# Patient Record
Sex: Female | Born: 1960 | Race: Black or African American | Hispanic: No | Marital: Single | Smoking: Never smoker
Health system: Southern US, Community
[De-identification: ages and names within clinical notes are randomized; demographics above are authoritative.]

## PROBLEM LIST (undated history)

## (undated) DIAGNOSIS — M1990 Unspecified osteoarthritis, unspecified site: Secondary | ICD-10-CM

## (undated) HISTORY — PX: ANKLE SURGERY: SHX546

---

## 2001-12-05 HISTORY — PX: CHOLECYSTECTOMY: SHX55

## 2002-12-05 HISTORY — PX: ABDOMINAL HYSTERECTOMY: SHX81

## 2014-02-22 ENCOUNTER — Encounter (HOSPITAL_COMMUNITY): Payer: Self-pay | Admitting: Emergency Medicine

## 2014-02-22 ENCOUNTER — Emergency Department (HOSPITAL_COMMUNITY): Payer: Medicare Other

## 2014-02-22 ENCOUNTER — Observation Stay (HOSPITAL_COMMUNITY)
Admission: EM | Admit: 2014-02-22 | Discharge: 2014-02-24 | Disposition: A | Discharge status: Home / Self Care | Payer: Medicare Other | Attending: Internal Medicine | Admitting: Internal Medicine

## 2014-02-22 DIAGNOSIS — R21 Rash and other nonspecific skin eruption: Secondary | ICD-10-CM | POA: Diagnosis present

## 2014-02-22 DIAGNOSIS — M129 Arthropathy, unspecified: Secondary | ICD-10-CM | POA: Insufficient documentation

## 2014-02-22 DIAGNOSIS — S99919A Unspecified injury of unspecified ankle, initial encounter: Secondary | ICD-10-CM

## 2014-02-22 DIAGNOSIS — R296 Repeated falls: Secondary | ICD-10-CM | POA: Insufficient documentation

## 2014-02-22 DIAGNOSIS — R55 Syncope and collapse: Principal | ICD-10-CM | POA: Insufficient documentation

## 2014-02-22 DIAGNOSIS — L0291 Cutaneous abscess, unspecified: Secondary | ICD-10-CM | POA: Insufficient documentation

## 2014-02-22 DIAGNOSIS — E669 Obesity, unspecified: Secondary | ICD-10-CM | POA: Insufficient documentation

## 2014-02-22 DIAGNOSIS — M25579 Pain in unspecified ankle and joints of unspecified foot: Secondary | ICD-10-CM | POA: Diagnosis present

## 2014-02-22 DIAGNOSIS — Y929 Unspecified place or not applicable: Secondary | ICD-10-CM | POA: Insufficient documentation

## 2014-02-22 DIAGNOSIS — L0390 Cellulitis, unspecified: Secondary | ICD-10-CM

## 2014-02-22 DIAGNOSIS — Y9389 Activity, other specified: Secondary | ICD-10-CM | POA: Insufficient documentation

## 2014-02-22 DIAGNOSIS — Z792 Long term (current) use of antibiotics: Secondary | ICD-10-CM | POA: Insufficient documentation

## 2014-02-22 DIAGNOSIS — S8990XA Unspecified injury of unspecified lower leg, initial encounter: Secondary | ICD-10-CM | POA: Insufficient documentation

## 2014-02-22 DIAGNOSIS — R110 Nausea: Secondary | ICD-10-CM | POA: Insufficient documentation

## 2014-02-22 DIAGNOSIS — S99929A Unspecified injury of unspecified foot, initial encounter: Secondary | ICD-10-CM

## 2014-02-22 DIAGNOSIS — H538 Other visual disturbances: Secondary | ICD-10-CM | POA: Insufficient documentation

## 2014-02-22 DIAGNOSIS — Z79899 Other long term (current) drug therapy: Secondary | ICD-10-CM | POA: Insufficient documentation

## 2014-02-22 HISTORY — DX: Unspecified osteoarthritis, unspecified site: M19.90

## 2014-02-22 LAB — I-STAT CHEM 8, ED
BUN: 11 mg/dL (ref 6–23)
CHLORIDE: 103 meq/L (ref 96–112)
Calcium, Ion: 1.2 mmol/L (ref 1.12–1.23)
Creatinine, Ser: 0.8 mg/dL (ref 0.50–1.10)
Glucose, Bld: 102 mg/dL — ABNORMAL HIGH (ref 70–99)
HCT: 46 % (ref 36.0–46.0)
Hemoglobin: 15.6 g/dL — ABNORMAL HIGH (ref 12.0–15.0)
Potassium: 4.1 mEq/L (ref 3.7–5.3)
SODIUM: 141 meq/L (ref 137–147)
TCO2: 26 mmol/L (ref 0–100)

## 2014-02-22 LAB — I-STAT TROPONIN, ED
Troponin i, poc: 0 ng/mL (ref 0.00–0.08)
Troponin i, poc: 0 ng/mL (ref 0.00–0.08)

## 2014-02-22 MED ORDER — HYDROXYZINE HCL 25 MG PO TABS
25.0000 mg | ORAL_TABLET | Freq: Once | ORAL | Status: AC
  Administered 2014-02-22: 25 mg via ORAL
  Filled 2014-02-22: qty 1

## 2014-02-22 MED ORDER — ENOXAPARIN SODIUM 80 MG/0.8ML ~~LOC~~ SOLN
80.0000 mg | Freq: Every day | SUBCUTANEOUS | Status: DC
  Administered 2014-02-23 – 2014-02-24 (×2): 80 mg via SUBCUTANEOUS
  Filled 2014-02-22 (×2): qty 0.8

## 2014-02-22 MED ORDER — HYDROXYZINE HCL 25 MG PO TABS: 25.0000 mg | ORAL_TABLET | Freq: Four times a day (QID) | ORAL | Status: DC

## 2014-02-22 MED ORDER — ONDANSETRON HCL 4 MG PO TABS: 4.0000 mg | ORAL_TABLET | Freq: Four times a day (QID) | ORAL | Status: DC | PRN

## 2014-02-22 MED ORDER — ASPIRIN EC 81 MG PO TBEC
81.0000 mg | DELAYED_RELEASE_TABLET | Freq: Every day | ORAL | Status: DC
  Administered 2014-02-23 – 2014-02-24 (×2): 81 mg via ORAL
  Filled 2014-02-22 (×2): qty 1

## 2014-02-22 MED ORDER — SODIUM CHLORIDE 0.9 % IV SOLN
INTRAVENOUS | Status: DC
  Administered 2014-02-23: 02:00:00 via INTRAVENOUS

## 2014-02-22 MED ORDER — ONDANSETRON HCL 4 MG/2ML IJ SOLN
4.0000 mg | Freq: Once | INTRAMUSCULAR | Status: AC
  Administered 2014-02-22: 4 mg via INTRAVENOUS
  Filled 2014-02-22: qty 2

## 2014-02-22 MED ORDER — ACETAMINOPHEN 325 MG PO TABS: 650.0000 mg | ORAL_TABLET | Freq: Four times a day (QID) | ORAL | Status: DC | PRN

## 2014-02-22 MED ORDER — ACETAMINOPHEN 650 MG RE SUPP: 650.0000 mg | Freq: Four times a day (QID) | RECTAL | Status: DC | PRN

## 2014-02-22 MED ORDER — ONDANSETRON HCL 4 MG/2ML IJ SOLN
4.0000 mg | Freq: Four times a day (QID) | INTRAMUSCULAR | Status: DC | PRN
Start: 2014-02-22 — End: 2014-02-24
  Administered 2014-02-24: 4 mg via INTRAVENOUS
  Filled 2014-02-22: qty 2

## 2014-02-22 MED ORDER — FAMOTIDINE 20 MG PO TABS
20.0000 mg | ORAL_TABLET | Freq: Two times a day (BID) | ORAL | Status: DC
  Administered 2014-02-23 – 2014-02-24 (×3): 20 mg via ORAL
  Filled 2014-02-22 (×5): qty 1

## 2014-02-22 MED ORDER — CLINDAMYCIN HCL 150 MG PO CAPS: 450.0000 mg | ORAL_CAPSULE | Freq: Three times a day (TID) | ORAL | Status: DC

## 2014-02-22 MED ORDER — SODIUM CHLORIDE 0.9 % IJ SOLN
3.0000 mL | Freq: Two times a day (BID) | INTRAMUSCULAR | Status: DC
  Administered 2014-02-23: 3 mL via INTRAVENOUS

## 2014-02-22 NOTE — ED Notes (Addendum)
Pt sts she was at the sink washing out a container then next thing she remembers is waking up on the floor. Pt sts only pain is to her left ankle. Denies neck pain, back pain, increased difficulty walking. Denies use of blood thinners. Pt has slight swelling to anterior lateral ankle. Pt has some red raised bumps on arms and legs, reports she had recent contact with a dog and they started after that. Pt reports normal PO intake the past couple of days. sts she drove into town early this morning, sts she did sleep good last night and felt normal this morning. Nad, skin warm and dry, resp e/u.

## 2014-02-22 NOTE — ED Notes (Signed)
Central monitor system down, unable to print ekg. EKG has been performed and viewable in EPIC

## 2014-02-22 NOTE — ED Notes (Signed)
Pt ambulated down hall by Debbie, NT. Pt O2 saturation 100%, HR 108. Pt had mild difficulty walking due to pain in left foot otherwise no drop in O2 noted.

## 2014-02-22 NOTE — H&P (Signed)
[facility] History and Physical  Kalen Neidert ZOX:[id number] DOB: 24-Jan-1961 DOA: 02/22/2014  Referring physician: Dr. Fredderick Phenix, ED physician PCP: No primary provider on file.   Chief Complaint: syncope  HPI: Molly Hoffman is a 53 y.o. female with history of morbid obesity and family history of coronary artery disease. Patient was in her usual state of health today, she was washing dishes when she suddenly had a syncopal episode. Per patient, she did not appear to have any presyncopal symptoms. She was watching dishes and then suddenly woke up on the floor. She denies any presyncopal lightheadedness, dizziness. She did not have any urinary or bowel incontinence. She did not have any postictal period. The entire episode reportedly lasted 1.5-2 minutes. Patient is now back to baseline. She did not have any chest pain or shortness of breath. No nausea vomiting, diarrhea. She reports her by mouth intake has been adequate for the past few days. She does not report having a fever. The patient had been traveling between [place] and [place]. She does not describe any sick contacts. She has no previous history of the same. She also reports noticing a erythematous rash wrapped over the last few days which has progressively gotten worse. Rash is pruritic. She feels may be related to possible flea bites from a dog she had held several days before. Rash is over her arms and her legs. Patient is being admitted to observation for further workup.   Review of Systems:  Pertinent positives as per HPI, otherwise negative  Past Medical History  Diagnosis Date  . Arthritis    Past Surgical History  Procedure Laterality Date  . Cesarean section  1984  . Abdominal hysterectomy  2004  . Ankle surgery Right   . Cholecystectomy  2003   Social History:  reports that she has never smoked. She does not have any smokeless tobacco history on file. She reports that she does not drink alcohol or use illicit  drugs.  No Known Allergies  Family history: sister died in her 30s due to MI   Prior to Admission medications   Medication Sig Start Date End Date Taking? Authorizing Provider  diclofenac (VOLTAREN) 25 MG EC tablet Take 25 mg by mouth 2 (two) times daily as needed for mild pain.    Yes Historical Provider, MD  loratadine (CLARITIN) 10 MG tablet Take 10 mg by mouth daily as needed for allergies.   Yes Historical Provider, MD  ranitidine (ZANTAC) 150 MG tablet Take 150 mg by mouth 2 (two) times daily as needed for heartburn.   Yes Historical Provider, MD  clindamycin (CLEOCIN) 150 MG capsule Take 3 capsules (450 mg total) by mouth 3 (three) times daily. 02/22/14   Lauren Doretha Imus, PA-C  hydrOXYzine (ATARAX/VISTARIL) 25 MG tablet Take 1 tablet (25 mg total) by mouth every 6 (six) hours. 02/22/14   Clabe Seal, PA-C   Physical Exam: Filed Vitals:   02/22/14 2241  BP: 107/51  Pulse:   Temp:   Resp: 25    BP 107/51  Pulse 84  Temp(Src) 98.5 F (36.9 C) (Oral)  Resp 25  Ht 5\' 2"  (1.575 m)  Wt 158.305 kg (349 lb)  BMI 63.82 kg/m2  SpO2 100%  General:  Appears calm and comfortable, morbidly obese Eyes: PERRL, normal lids, irises & conjunctiva ENT: grossly normal hearing, lips & tongue Neck: no LAD, masses or thyromegaly Cardiovascular: RRR, no m/r/g. No LE edema. Telemetry: SR, no arrhythmias  Respiratory: CTA bilaterally, no w/r/r. Normal respiratory  effort. Abdomen: soft, ntnd Skin: patchy erythematous lesions on arms and legs, pruritic, no central clearing Musculoskeletal: grossly normal tone BUE/BLE Psychiatric: grossly normal mood and affect, speech fluent and appropriate Neurologic: grossly non-focal.          Labs on Admission:  Basic Metabolic Panel:  Recent Labs Lab 02/22/14 1533  NA 141  K 4.1  CL 103  GLUCOSE 102*  BUN 11  CREATININE 0.80   Liver Function Tests: No results found for this basename: AST, ALT, ALKPHOS, BILITOT, PROT, ALBUMIN,  in the  last 168 hours No results found for this basename: LIPASE, AMYLASE,  in the last 168 hours No results found for this basename: AMMONIA,  in the last 168 hours CBC:  Recent Labs Lab 02/22/14 1533  HGB 15.6*  HCT 46.0   Cardiac Enzymes: No results found for this basename: CKTOTAL, CKMB, CKMBINDEX, TROPONINI,  in the last 168 hours  BNP (last 3 results) No results found for this basename: PROBNP,  in the last 8760 hours CBG: No results found for this basename: GLUCAP,  in the last 168 hours  Radiological Exams on Admission: Dg Foot Complete Left  02/22/2014   CLINICAL DATA:  Left foot pain after injury.  EXAM: LEFT FOOT - COMPLETE 3+ VIEW  COMPARISON:  None.  FINDINGS: There is no evidence of fracture or dislocation. Joint spaces are intact. Spurring of posterior calcaneus is noted. Soft tissues are unremarkable.  IMPRESSION: No acute abnormality seen in the left foot.   Electronically Signed   By: Roque LiasJames  Birdsall M.D.   On: 02/22/2014 16:07    EKG: Independently reviewed. No acute findings  Assessment/Plan Principal Problem:   Syncope Active Problems:   Rash   Morbid obesity   Ankle pain   1. Syncope. Possibly vasovagal in origin. Patient was monitored under observation on telemetry. We'll cycle cardiac markers. Check EKG in the morning and 2-D echocardiogram. Patient will be hydrated with IV fluids. Check orthostatic vital signs on arrival to the nursing unit. Of note, there was a recorded blood pressure of 83/49 on admission. That has since improved with IV fluids. 2. Ankle pain. Patient reports pain in her ankle since falling. We will check d-dimer. X-rays of the ankle are unremarkable. She does have pain on flexion, but this may be related to musculoskeletal injury. If D-dimer is positive, would consider checking venous Dopplers and possible CT scan to the chest in light of #1. 3. Rash. Etiology is unclear. Does not appear allergic in origin. She does not report any new  medications. Its appearance suggests possible insect bites, although patient does not report history of the same. We'll check RPR. This can possibly be worked up further as an outpatient. 4. Morbid obesity. Counseled on the importance of diet and exercise.   Code Status: full code Family Communication: discussed with patient Disposition Plan: discharge home once improved  Time spent: 60mins  [facility] Surgery Center IncMEMON,JEHANZEB [facility] Pager [phone]

## 2014-02-22 NOTE — ED Notes (Signed)
Per EMS - pt was cleaning at the sink with she had a syncopal episode and fell, family witnessed this episode stating it lasted for about 2 mins, pt hit head on stove when she fell down. Pt denies any cardiac hx, only hx of arthritis. Pt denies neck/back pain, passed ems spinal test. 20 G left AC. Pt c/o nausea and left ankle pain. BP 138/70 HR 90s CBG 97. ST on 12 lead.

## 2014-02-22 NOTE — ED Notes (Signed)
Phlebotomy at bedside, attempting to get troponin.

## 2014-02-22 NOTE — ED Notes (Signed)
Pt up to bathroom, able to bear wt on left foot.

## 2014-02-22 NOTE — ED Notes (Signed)
Patient transported to X-ray 

## 2014-02-22 NOTE — ED Notes (Signed)
Admitting physician at bedside

## 2014-02-22 NOTE — ED Provider Notes (Signed)
CSN: [id number]     Arrival date & time 02/22/14  1501 History   First MD Initiated Contact with Patient 02/22/14 1512     Chief Complaint  Patient presents with  . Loss of Consciousness     (Consider location/radiation/quality/duration/timing/severity/associated sxs/prior Treatment) HPI Comments: Molly Hoffman is a 53 y.o. female with a past medical history of obesity presenting the Emergency Department with a chief complaint of syncopal event.  The patient reports she was standing at the sink washing dishes when she had blurry vision and passed out.  The patient's nice reports witnessing the event and reports the patient fell backward and is unsure if she hit her head on the stove. She reports the patient had a LOC for approximately 1.5 minutes.  She reports she awoke by herself.  Denies Loss of bowel or bladder, denies extremity shaking or movements.  The patient reports vision returned to baseline. She denies headache, vomiting, lightheaded, dizziness.  She denies history of MI, murmur, arrythmia, denies history of early MI in family.  She denies chest pain, palpitations, SOB, lower extremity swelling.  She reports rash for several days to left upper extremity and lower extremities. Denies dark stools or BRBPR. PCP in [place]. Reports she moved to [place] for the next 6 months  The history is provided by the patient and a relative. No language interpreter was used.    Past Medical History  Diagnosis Date  . Arthritis    Past Surgical History  Procedure Laterality Date  . Cesarean section  1984  . Abdominal hysterectomy  2004  . Ankle surgery Right   . Cholecystectomy  2003   No family history on file. History  Substance Use Topics  . Smoking status: Never Smoker   . Smokeless tobacco: Not on file  . Alcohol Use: No   OB History   Grav Para Term Preterm Abortions TAB SAB Ect Mult Living                 Review of Systems  Constitutional: Negative for fever and chills.    Eyes: Negative for photophobia.  Cardiovascular: Negative for chest pain, palpitations and leg swelling.  Gastrointestinal: Positive for nausea. Negative for vomiting, abdominal pain, diarrhea, constipation, blood in stool and anal bleeding.  Neurological: Positive for syncope. Negative for dizziness, light-headedness and headaches.  All other systems reviewed and are negative.      Allergies  Review of patient's allergies indicates no known allergies.  Home Medications   Current Outpatient Rx  Name  Route  Sig  Dispense  Refill  . diclofenac (VOLTAREN) 25 MG EC tablet   Oral   Take 25 mg by mouth 2 (two) times daily as needed for mild pain.          Marland Kitchen loratadine (CLARITIN) 10 MG tablet   Oral   Take 10 mg by mouth daily as needed for allergies.         . ranitidine (ZANTAC) 150 MG tablet   Oral   Take 150 mg by mouth 2 (two) times daily as needed for heartburn.         . clindamycin (CLEOCIN) 150 MG capsule   Oral   Take 3 capsules (450 mg total) by mouth 3 (three) times daily.   90 capsule   0   . hydrOXYzine (ATARAX/VISTARIL) 25 MG tablet   Oral   Take 1 tablet (25 mg total) by mouth every 6 (six) hours.   20 tablet   0  BP 98/64  Pulse 92  Temp(Src) 98.5 F (36.9 C) (Oral)  Resp 25  Ht 5\' 2"  (1.575 m)  Wt 349 lb (158.305 kg)  BMI 63.82 kg/m2  SpO2 98% Physical Exam  Nursing note and vitals reviewed. Constitutional: She is oriented to person, place, and time. She appears well-developed and well-nourished. No distress.  Exam limited by patient's body habitus.    HENT:  Head: Normocephalic and atraumatic.  Eyes: EOM are normal. Pupils are equal, round, and reactive to light. No scleral icterus.  Neck: Neck supple.  Cardiovascular: Normal rate, regular rhythm and normal heart sounds.   No murmur heard. No lower extremity edema  Pulmonary/Chest: Effort normal and breath sounds normal. She has no wheezes. She has no rales.  Abdominal: Soft.  Bowel sounds are normal. There is no tenderness. There is no rebound and no guarding.  Musculoskeletal: Normal range of motion. She exhibits no edema.       Left ankle: Tenderness.       Feet:  Neurological: She is alert and oriented to person, place, and time.  Speech is clear and goal oriented, follows commands Cranial nerves III - XII grossly intact, no facial droop Normal strength in upper and lower extremities bilaterally, strong and equal grip strength Sensation normal to light and sharp touch Moves all 4 extremities without ataxia, coordination intact Normal finger to nose and rapid alternating movements No pronator drift. Normal heel to shin, bilaterally    Skin: Skin is warm and dry. Rash noted.  Multiple areas of erythremic lesions without obvious abscess.   Psychiatric: She has a normal mood and affect.    ED Course  Procedures (including critical care time) Labs Review Labs Reviewed  I-STAT CHEM 8, ED - Abnormal; Notable for the following:    Glucose, Bld 102 (*)    Hemoglobin 15.6 (*)    All other components within normal limits  I-STAT TROPOININ, ED  I-STAT TROPOININ, ED   Imaging Review Dg Foot Complete Left  02/22/2014   CLINICAL DATA:  Left foot pain after injury.  EXAM: LEFT FOOT - COMPLETE 3+ VIEW  COMPARISON:  None.  FINDINGS: There is no evidence of fracture or dislocation. Joint spaces are intact. Spurring of posterior calcaneus is noted. Soft tissues are unremarkable.  IMPRESSION: No acute abnormality seen in the left foot.   Electronically Signed   By: Roque Lias M.D.   On: 02/22/2014 16:07     EKG Interpretation   Date/Time:  Saturday February 22 2014 15:09:40 EDT Ventricular Rate:  88 PR Interval:  170 QRS Duration: 84 QT Interval:  382 QTC Calculation: 462 R Axis:   47 Text Interpretation:  Sinus rhythm No old tracing to compare Confirmed by  BELFI  MD, MELANIE ([id number]) on 02/22/2014 3:39:05 PM      MDM   Final diagnoses:  Syncope   Cellulitis  Rash   Pt with an episode of syncope, asymptomatic here. No Cardiac complains of Neurologic findings on exam.Complains of nausea and purities from the rash. Labs and XR normal.  EKG, normal. Re-eval-Pt currently asymptomatic, denies HA or lightheadedness.  2110 Re-eval pt denies HA, chest pain, SOB. Upon further questioning the patient reports her sister died at 12 due to a heart attack while visiting her father in the hospital while he was in a coma. HEART score 2 points, 0.9-1.7% risk. Discussed lab results, imaging results with the patient and patient's sister.  Pt is requesting to stay for further evaluation at this  time. Pt care signed out to Piepenbrink, PA-C for admission.   Meds given in ED:  Medications  hydrOXYzine (ATARAX/VISTARIL) tablet 25 mg (not administered)  ondansetron (ZOFRAN) injection 4 mg (4 mg Intravenous Given 02/22/14 1630)  hydrOXYzine (ATARAX/VISTARIL) tablet 25 mg (25 mg Oral Given 02/22/14 1929)    New Prescriptions   CLINDAMYCIN (CLEOCIN) 150 MG CAPSULE    Take 3 capsules (450 mg total) by mouth 3 (three) times daily.   HYDROXYZINE (ATARAX/VISTARIL) 25 MG TABLET    Take 1 tablet (25 mg total) by mouth every 6 (six) hours.      Clabe SealLauren M Anedra Penafiel, PA-C 02/22/14 2125

## 2014-02-22 NOTE — ED Provider Notes (Signed)
Patient is a 53 yo F PMHx significant for arthritis and obesity presenting for syncopal episode with visual disturbance. No CP, SOB, dizziness, lightheadedness. Symptoms resolved once patient awoke. No further episodes. No neurofocal findings on examination per Mellody DrownLauren Parker, PA-C upon arrival. I have reviewed nursing notes, vital signs, and all appropriate lab results for this patient. EKG unremarkable. Patient is visiting from out of town. No follow up here. Family history of sudden cardiac death. Patient uncomfortable with discharge home. Will observe patient overnight. Discussed with hospitalist.   1. Syncope   2. Cellulitis   3. Rash   4. Ankle pain   5. Morbid obesity     Medications  ondansetron (ZOFRAN) injection 4 mg (4 mg Intravenous Given 02/22/14 1630)  hydrOXYzine (ATARAX/VISTARIL) tablet 25 mg (25 mg Oral Given 02/22/14 1929)  hydrOXYzine (ATARAX/VISTARIL) tablet 25 mg (25 mg Oral Given 02/22/14 2132)   Results for orders placed during the hospital encounter of 02/22/14  I-STAT CHEM 8, ED      Result Value Ref Range   Sodium 141  137 - 147 mEq/L   Potassium 4.1  3.7 - 5.3 mEq/L   Chloride 103  96 - 112 mEq/L   BUN 11  6 - 23 mg/dL   Creatinine, Ser 1.610.80  0.50 - 1.10 mg/dL   Glucose, Bld 096102 (*) 70 - 99 mg/dL   Calcium, Ion 0.451.20  1.12 - 1.23 mmol/L   TCO2 26  0 - 100 mmol/L   Hemoglobin 15.6 (*) 12.0 - 15.0 g/dL   HCT 40.946.0  81.136.0 - 91.446.0 %  I-STAT TROPOININ, ED      Result Value Ref Range   Troponin i, poc 0.00  0.00 - 0.08 ng/mL   Comment 3           I-STAT TROPOININ, ED      Result Value Ref Range   Troponin i, poc 0.00  0.00 - 0.08 ng/mL   Comment 3            Dg Foot Complete Left  02/22/2014   CLINICAL DATA:  Left foot pain after injury.  EXAM: LEFT FOOT - COMPLETE 3+ VIEW  COMPARISON:  None.  FINDINGS: There is no evidence of fracture or dislocation. Joint spaces are intact. Spurring of posterior calcaneus is noted. Soft tissues are unremarkable.  IMPRESSION: No  acute abnormality seen in the left foot.   Electronically Signed   By: Roque LiasJames  Denning M.D.   On: 02/22/2014 16:07       Jeannetta EllisJennifer L Jarious Lyon, PA-C 02/22/14 2331

## 2014-02-23 ENCOUNTER — Observation Stay (HOSPITAL_COMMUNITY): Payer: Medicare Other

## 2014-02-23 ENCOUNTER — Encounter (HOSPITAL_COMMUNITY): Payer: Self-pay | Admitting: *Deleted

## 2014-02-23 DIAGNOSIS — R55 Syncope and collapse: Secondary | ICD-10-CM

## 2014-02-23 LAB — CBC
HCT: 39.8 % (ref 36.0–46.0)
HCT: 41.7 % (ref 36.0–46.0)
Hemoglobin: 13.1 g/dL (ref 12.0–15.0)
Hemoglobin: 13.7 g/dL (ref 12.0–15.0)
MCH: 27.1 pg (ref 26.0–34.0)
MCH: 27.2 pg (ref 26.0–34.0)
MCHC: 32.9 g/dL (ref 30.0–36.0)
MCHC: 32.9 g/dL (ref 30.0–36.0)
MCV: 82.4 fL (ref 78.0–100.0)
MCV: 82.7 fL (ref 78.0–100.0)
PLATELETS: 201 10*3/uL (ref 150–400)
Platelets: 199 10*3/uL (ref 150–400)
RBC: 4.83 MIL/uL (ref 3.87–5.11)
RBC: 5.04 MIL/uL (ref 3.87–5.11)
RDW: 14.3 % (ref 11.5–15.5)
RDW: 14.3 % (ref 11.5–15.5)
WBC: 6.4 10*3/uL (ref 4.0–10.5)
WBC: 6.2 10*3/uL (ref 4.0–10.5)

## 2014-02-23 LAB — COMPREHENSIVE METABOLIC PANEL
ALBUMIN: 3.2 g/dL — AB (ref 3.5–5.2)
ALT: 13 U/L (ref 0–35)
AST: 16 U/L (ref 0–37)
Alkaline Phosphatase: 133 U/L — ABNORMAL HIGH (ref 39–117)
BUN: 10 mg/dL (ref 6–23)
CALCIUM: 9.2 mg/dL (ref 8.4–10.5)
CO2: 24 mEq/L (ref 19–32)
CREATININE: 0.77 mg/dL (ref 0.50–1.10)
Chloride: 103 mEq/L (ref 96–112)
GFR calc Af Amer: >90 mL/min (ref >90)
GFR calc non Af Amer: >90 mL/min (ref >90)
Glucose, Bld: 125 mg/dL — ABNORMAL HIGH (ref 70–99)
Potassium: 4.1 mEq/L (ref 3.7–5.3)
Sodium: 140 mEq/L (ref 137–147)
Total Bilirubin: 0.5 mg/dL (ref 0.3–1.2)
Total Protein: 7.3 g/dL (ref 6.0–8.3)

## 2014-02-23 LAB — URINALYSIS, ROUTINE W REFLEX MICROSCOPIC
Bilirubin Urine: NEGATIVE
GLUCOSE, UA: NEGATIVE mg/dL
Hgb urine dipstick: NEGATIVE
Ketones, ur: NEGATIVE mg/dL
LEUKOCYTES UA: NEGATIVE
Nitrite: NEGATIVE
PH: 5.5 (ref 5.0–8.0)
Protein, ur: NEGATIVE mg/dL
Specific Gravity, Urine: 1.031 — ABNORMAL HIGH (ref 1.005–1.030)
Urobilinogen, UA: 0.2 mg/dL (ref 0.0–1.0)

## 2014-02-23 LAB — TROPONIN I
Troponin I: <0.3 ng/mL (ref <0.30)
Troponin I: <0.3 ng/mL (ref <0.30)

## 2014-02-23 LAB — CREATININE, SERUM: Creatinine, Ser: 0.79 mg/dL (ref 0.50–1.10)

## 2014-02-23 LAB — RPR: RPR Ser Ql: NONREACTIVE

## 2014-02-23 LAB — D-DIMER, QUANTITATIVE (NOT AT ARMC): D DIMER QUANT: 2.92 ug{FEU}/mL — AB (ref 0.00–0.48)

## 2014-02-23 LAB — HIV ANTIBODY (ROUTINE TESTING W REFLEX): HIV: NONREACTIVE

## 2014-02-23 LAB — TSH: TSH: 1.699 u[IU]/mL (ref 0.350–4.500)

## 2014-02-23 MED ORDER — SODIUM CHLORIDE 0.9 % IV SOLN: INTRAVENOUS | Status: AC

## 2014-02-23 MED ORDER — HYDROCORTISONE 0.5 % EX CREA
TOPICAL_CREAM | Freq: Two times a day (BID) | CUTANEOUS | Status: DC
  Administered 2014-02-23 – 2014-02-24 (×4): via TOPICAL
  Filled 2014-02-23: qty 28.35

## 2014-02-23 MED ORDER — DIPHENHYDRAMINE HCL 25 MG PO CAPS: 25.0000 mg | ORAL_CAPSULE | Freq: Four times a day (QID) | ORAL | Status: DC | PRN

## 2014-02-23 MED ORDER — SODIUM CHLORIDE 0.9 % IV SOLN: INTRAVENOUS | Status: DC

## 2014-02-23 MED ORDER — DIPHENHYDRAMINE HCL 25 MG PO CAPS: 25.0000 mg | ORAL_CAPSULE | Freq: Three times a day (TID) | ORAL | Status: AC | PRN

## 2014-02-23 MED ORDER — DIPHENHYDRAMINE HCL 25 MG PO CAPS: 25.0000 mg | ORAL_CAPSULE | Freq: Three times a day (TID) | ORAL | Status: DC | PRN

## 2014-02-23 MED ORDER — IOHEXOL 350 MG/ML SOLN
100.0000 mL | Freq: Once | INTRAVENOUS | Status: AC | PRN
  Administered 2014-02-23: 100 mL via INTRAVENOUS

## 2014-02-23 MED ORDER — DOXYCYCLINE HYCLATE 100 MG PO TABS
100.0000 mg | ORAL_TABLET | Freq: Two times a day (BID) | ORAL | Status: DC
  Administered 2014-02-23 – 2014-02-24 (×3): 100 mg via ORAL
  Filled 2014-02-23 (×4): qty 1

## 2014-02-23 MED ORDER — HYDROCODONE-ACETAMINOPHEN 5-325 MG PO TABS
1.0000 | ORAL_TABLET | Freq: Three times a day (TID) | ORAL | Status: DC | PRN
  Administered 2014-02-23 (×2): 1 via ORAL
  Filled 2014-02-23 (×3): qty 1

## 2014-02-23 MED ORDER — DOXYCYCLINE HYCLATE 100 MG PO TABS: 100.0000 mg | ORAL_TABLET | Freq: Two times a day (BID) | ORAL | Status: AC

## 2014-02-23 NOTE — Progress Notes (Signed)
Patient's d-dimer was positive at 2.92. Notified Dr. Joseph ArtWoods. Patient is asymptomatic at this time. New orders for a CT-Angio STAT. Will notify the patient and continue to monitor. Earnest ConroyFarlow, Aryanne Gilleland Denise RN

## 2014-02-23 NOTE — ED Provider Notes (Signed)
Medical screening examination/treatment/procedure(s) were performed by non-physician practitioner and as supervising physician I was immediately available for consultation/collaboration.   EKG Interpretation   Date/Time:  Saturday February 22 2014 15:09:40 EDT Ventricular Rate:  88 PR Interval:  170 QRS Duration: 84 QT Interval:  382 QTC Calculation: 462 R Axis:   47 Text Interpretation:  Sinus rhythm No old tracing to compare Confirmed by  Jocob Dambach  MD, Annaclaire Walsworth ([id number]) on 02/22/2014 3:39:05 PM        Rolan BuccoMelanie Eliyanna Ault, MD 02/23/14 1059

## 2014-02-23 NOTE — Progress Notes (Addendum)
Patient Demographics  Molly Hoffman, is a 53 y.o. female, DOB - 30-Nov-1961, WUJ:[id number]  Admit date - 02/22/2014   Admitting Physician Erick BlinksJehanzeb Memon, MD  Outpatient Primary MD for the patient is No primary provider on file.  LOS - 1   Chief Complaint  Patient presents with  . Loss of Consciousness        Assessment & Plan    1. Syncope - most likely secondary to orthostatic hypotension, will give IV fluids, monitor orthostatics, increase activity, cycle troponins which have been negative, have PT eval. Echo ordered upon admission is pending. If symptom free after IV fluids an echo stable can be discharged home.    2. Left ankle discomfort and pain. X-ray of ankle and foot stable, likely soft tissue sprain, will give trial of NSAIDs and monitor. He due to evaluate, walking cane ordered.    3. Morbid obesity. Outpatient followup with PCP.    4.Rash - on R arm and thigh - ? Contact dermatitis vs skin infection - doxy and benadryl     Code Status: Full  Family Communication:    Disposition Plan: Home   Procedures Echo   Consults       Medications  Scheduled Meds: . aspirin EC  81 mg Oral Daily  . enoxaparin (LOVENOX) injection  80 mg Subcutaneous Daily  . famotidine  20 mg Oral BID  . hydrocortisone cream   Topical BID  . sodium chloride  3 mL Intravenous Q12H   Continuous Infusions: . sodium chloride     PRN Meds:.acetaminophen, acetaminophen, HYDROcodone-acetaminophen, ondansetron (ZOFRAN) IV, ondansetron  DVT Prophylaxis  Lovenox    Lab Results  Component Value Date   PLT 201 02/23/2014    Antibiotics     Anti-infectives   Start     Dose/Rate Route Frequency Ordered Stop   02/22/14 0000  clindamycin (CLEOCIN) 150 MG capsule     450 mg Oral 3 times daily  02/22/14 2056            Subjective:   Molly Hoffman today has, No headache, No chest pain, No abdominal pain - No Nausea, No new weakness tingling or numbness, No Cough - SOB.    Objective:   Filed Vitals:   02/23/14 0000 02/23/14 0001 02/23/14 0002 02/23/14 0625  BP: 137/62 107/58 120/65 127/66  Pulse: 85 84 107 88  Temp: 98.4 F (36.9 C)   98.9 F (37.2 C)  TempSrc: Oral   Oral  Resp: 16   16  Height: 5\' 2"  (1.575 m)     Weight: 162.887 kg (359 lb 1.6 oz)     SpO2: 100%   98%    Wt Readings from Last 3 Encounters:  02/23/14 162.887 kg (359 lb 1.6 oz)    No intake or output data in the 24 hours ending 02/23/14 1005   Physical Exam  Awake Alert, Oriented X 3, No new F.N deficits, Normal affect [place].AT,PERRAL Supple Neck,No JVD, No cervical lymphadenopathy appriciated.  Symmetrical Chest wall movement, Good air movement bilaterally, CTAB RRR,No Gallops,Rubs or new Murmurs, No Parasternal Heave +ve B.Sounds, Abd Soft, Non tender, No organomegaly appriciated, No rebound - guarding or rigidity. No Cyanosis, Clubbing or edema, No new Rash or bruise  Data Review   Micro Results No results found for this or any previous visit (from the past 240 hour(s)).  Radiology Reports Dg Ankle Complete Left  02/23/2014   CLINICAL DATA:  Fall, left ankle pain  EXAM: LEFT ANKLE COMPLETE - 3+ VIEW  COMPARISON:  None.  FINDINGS: No fracture or dislocation is seen.  The joint spaces are preserved.  Moderate plantar and posterior calcaneal enthesophytes.  Visualized soft tissues are grossly unremarkable.  IMPRESSION: No fracture or dislocation is seen.   Electronically Signed   By: Charline Bills M.D.   On: 02/23/2014 08:39   Dg Foot Complete Left  02/22/2014   CLINICAL DATA:  Left foot pain after injury.  EXAM: LEFT FOOT - COMPLETE 3+ VIEW  COMPARISON:  None.  FINDINGS: There is no evidence of fracture or dislocation. Joint spaces are intact. Spurring of posterior calcaneus is  noted. Soft tissues are unremarkable.  IMPRESSION: No acute abnormality seen in the left foot.   Electronically Signed   By: Roque Lias M.D.   On: 02/22/2014 16:07    CBC  Recent Labs Lab 02/22/14 1533 02/23/14 0546 02/23/14 0847  WBC  --  6.4 6.2  HGB 15.6* 13.1 13.7  HCT 46.0 39.8 41.7  PLT  --  199 201  MCV  --  82.4 82.7  MCH  --  27.1 27.2  MCHC  --  32.9 32.9  RDW  --  14.3 14.3    Chemistries   Recent Labs Lab 02/22/14 1533 02/23/14 0546 02/23/14 0847  NA 141 140  --   K 4.1 4.1  --   CL 103 103  --   CO2  --  24  --   GLUCOSE 102* 125*  --   BUN 11 10  --   CREATININE 0.80 0.77 0.79  CALCIUM  --  9.2  --   AST  --  16  --   ALT  --  13  --   ALKPHOS  --  133*  --   BILITOT  --  0.5  --    ------------------------------------------------------------------------------------------------------------------ estimated creatinine clearance is 125 ml/min (by C-G formula based on Cr of 0.79). ------------------------------------------------------------------------------------------------------------------ No results found for this basename: HGBA1C,  in the last 72 hours ------------------------------------------------------------------------------------------------------------------ No results found for this basename: CHOL, HDL, LDLCALC, TRIG, CHOLHDL, LDLDIRECT,  in the last 72 hours ------------------------------------------------------------------------------------------------------------------ No results found for this basename: TSH, T4TOTAL, FREET3, T3FREE, THYROIDAB,  in the last 72 hours ------------------------------------------------------------------------------------------------------------------ No results found for this basename: VITAMINB12, FOLATE, FERRITIN, TIBC, IRON, RETICCTPCT,  in the last 72 hours  Coagulation profile No results found for this basename: INR, PROTIME,  in the last 168 hours   Recent Labs  02/23/14 0847  DDIMER 2.92*     Cardiac Enzymes  Recent Labs Lab 02/23/14 0546  TROPONINI <0.30   ------------------------------------------------------------------------------------------------------------------ No components found with this basename: POCBNP,      Time Spent in minutes 35   Jasmyne Lodato K M.D on 02/23/2014 at 10:05 AM  Between 7am to 7pm - Pager - [phone]  After 7pm go to [email or web address] - password [email or web address]  And look for the night coverage person covering for me after hours  [facility] Office  [phone]

## 2014-02-23 NOTE — Care Management Note (Signed)
         A/P  -D-Dimer 2.92; CT angiogram pending

## 2014-02-23 NOTE — Progress Notes (Signed)
Utilization review completed.  

## 2014-02-24 DIAGNOSIS — I369 Nonrheumatic tricuspid valve disorder, unspecified: Secondary | ICD-10-CM

## 2014-02-24 MED ORDER — SODIUM CHLORIDE 0.9 % IV SOLN
INTRAVENOUS | Status: DC
  Administered 2014-02-24: 05:00:00 via INTRAVENOUS

## 2014-02-24 NOTE — Progress Notes (Signed)
DC orders received.  Patient stable with no S/S of distress.  Medication and discharge information reviewed with patient.  Patient DC home. Molly Hoffman  

## 2014-02-24 NOTE — Progress Notes (Signed)
  Page secondary to patient's d-dimer=2.92. Patient asymptomatic ambulating around the ward.             A/P  -D-Dimer 2.92; CT angiogram 02/23/2014  Examination is limited by patient body habitus. No definite filling  defects are identified in the main, lobar, or proximal segmental  pulmonary arteries.

## 2014-02-24 NOTE — Progress Notes (Signed)
PT Cancellation Note  Patient Details Name: Molly Hoffman MRN: [id number] DOB: January 24, 1961   Cancelled Treatment:    Reason Eval/Treat Not Completed: Other (comment) (pt bathing).  PT to check back later this morning.    Thanks,    Molly Hoffman, PT, DPT [phone]   02/24/2014, 10:49 AM

## 2014-02-24 NOTE — ED Provider Notes (Signed)
Medical screening examination/treatment/procedure(s) were performed by non-physician practitioner and as supervising physician I was immediately available for consultation/collaboration.   EKG Interpretation   Date/Time:  Saturday February 22 2014 15:09:40 EDT Ventricular Rate:  88 PR Interval:  170 QRS Duration: 84 QT Interval:  382 QTC Calculation: 462 R Axis:   47 Text Interpretation:  Sinus rhythm No old tracing to compare Confirmed by  Latavious Bitter  MD, Murl Golladay ([id number]) on 02/22/2014 3:39:05 PM        Rolan BuccoMelanie Shaneece Stockburger, MD 02/24/14 1908

## 2014-02-24 NOTE — Discharge Summary (Signed)
Physician Discharge Summary  Molly Hoffman ZOX:[id number] DOB: [birth year] DOA: 02/22/2014  PCP: No primary provider on file.  Admit date: 02/22/2014 Discharge date: 02/24/2014  Time spent: 35 minutes  Recommendations for Outpatient Follow-up:  1. Followup with primary care provider in one week   Discharge Diagnoses:  Principal Problem:   Syncope Active Problems:   Rash   Morbid obesity   Ankle pain  Discharge Condition: Stable  Diet recommendation: Heart healthy  Filed Weights   02/22/14 1511 02/23/14 0000  Weight: 158.305 kg (349 lb) 162.887 kg (359 lb 1.6 oz)   History of present illness:  Molly Hoffman is a 53 y.o. female with history of morbid obesity and family history of coronary artery disease. Patient was in her usual state of health today, she was washing dishes when she suddenly had a syncopal episode. Per patient, she did not appear to have any presyncopal symptoms. She was watching dishes and then suddenly woke up on the floor. She denies any presyncopal lightheadedness, dizziness. She did not have any urinary or bowel incontinence. She did not have any postictal period. The entire episode reportedly lasted 1.5-2 minutes. Patient is now back to baseline. She did not have any chest pain or shortness of breath. No nausea vomiting, diarrhea. She reports her by mouth intake has been adequate for the past few days. She does not report having a fever. The patient had been traveling between [place] and [place]. She does not describe any sick contacts. She has no previous history of the same. She also reports noticing a erythematous rash wrapped over the last few days which has progressively gotten worse. Rash is pruritic. She feels may be related to possible flea bites from a dog she had held several days before. Rash is over her arms and her legs. Patient is being admitted to observation for further workup.  Hospital Course:  Syncope - most likely secondary to orthostatic  hypotension, will give IV fluids, monitor orthostatics, increase activity, cycle troponins which have been negative, have PT eval. Echo ordered upon admission as below. After hydration, patient completely asymptomatic, able to ambulate in the hallway without any chest pain lightheadedness or dizziness. She was discharged home in stable condition instructed to followup with her primary care provider in one week. Patient worked with physical therapy without any problems, and no PT followup was recommended. Left ankle discomfort and pain. X-ray of ankle and foot stable, likely soft tissue sprain, advised to use NSAIDs, ice and leg elevation.  Morbid obesity. Outpatient followup with PCP.  Rash - on R arm and thigh - likely due to contact dermatitis, less likely cellulitis.  Procedures:  2D echo  Study Conclusions - Left ventricle: The cavity size was normal. Systolic function was normal. The estimated ejection fraction was in the range of 60% to 65%. Wall motion was normal; there were no regional wall motion abnormalities. - Left atrium: The atrium was mildly dilated. - Atrial septum: No defect or patent foramen ovale was identified.  Consultations:  none  Discharge Exam: Filed Vitals:   02/23/14 2100 02/24/14 0555 02/24/14 1203 02/24/14 1410  BP: 139/69 97/42  143/80  Pulse: 94 81 73 88  Temp: 98.3 F (36.8 C) 98.5 F (36.9 C)  98.3 F (36.8 C)  TempSrc: Oral Oral  Oral  Resp: 16 16  16   Height:      Weight:      SpO2: 95% 99% 96% 100%   General: NAD Cardiovascular: RRR Respiratory: CTA biL  Discharge Instructions  Discharge Orders   Future Orders Complete By Expires   Diet - low sodium heart healthy  As directed    Discharge instructions  As directed    Comments:     Follow with Primary MD and local skin doctor  in 7 days   Get CBC, CMP, checked 7 days by Primary MD and again as instructed by your Primary MD. Get a 2 view Chest X ray done next visit if you had Pneumonia of  Lung problems at the Hospital.   Activity: As tolerated with Full fall precautions use walker/cane & assistance as needed   Disposition Home     Diet: Heart Healthy    For Heart failure patients - Check your Weight same time everyday, if you gain over 2 pounds, or you develop in leg swelling, experience more shortness of breath or chest pain, call your Primary MD immediately. Follow Cardiac Low Salt Diet and 1.8 lit/day fluid restriction.   On your next visit with her primary care physician please Get Medicines reviewed and adjusted.  Please request your Prim.MD to go over all Hospital Tests and Procedure/Radiological results at the follow up, please get all Hospital records sent to your Prim MD by signing hospital release before you go home.   If you experience worsening of your admission symptoms, develop shortness of breath, life threatening emergency, suicidal or homicidal thoughts you must seek medical attention immediately by calling 911 or calling your MD immediately  if symptoms less severe.  You Must read complete instructions/literature along with all the possible adverse reactions/side effects for all the Medicines you take and that have been prescribed to you. Take any new Medicines after you have completely understood and accpet all the possible adverse reactions/side effects.   Do not drive and provide baby sitting services if your were admitted for syncope or siezures until you have seen by Primary MD or a Neurologist and advised to do so again.  Do not drive when taking Pain medications.    Do not take more than prescribed Pain, Sleep and Anxiety Medications  Special Instructions: If you have smoked or chewed Tobacco  in the last 2 yrs please stop smoking, stop any regular Alcohol  and or any Recreational drug use.  Wear Seat belts while driving.   Please note  You were cared for by a hospitalist during your hospital stay. If you have any questions about your  discharge medications or the care you received while you were in the hospital after you are discharged, you can call the unit and asked to speak with the hospitalist on call if the hospitalist that took care of you is not available. Once you are discharged, your primary care physician will handle any further medical issues. Please note that NO REFILLS for any discharge medications will be authorized once you are discharged, as it is imperative that you return to your primary care physician (or establish a relationship with a primary care physician if you do not have one) for your aftercare needs so that they can reassess your need for medications and monitor your lab values.   Increase activity slowly  As directed        Medication List         diclofenac 25 MG EC tablet  Commonly known as:  VOLTAREN  Take 25 mg by mouth 2 (two) times daily as needed for mild pain.     diphenhydrAMINE 25 mg capsule  Commonly known as:  BENADRYL  Take 1 capsule (25 mg total) by mouth every 8 (eight) hours as needed for itching.     doxycycline 100 MG tablet  Commonly known as:  VIBRA-TABS  Take 1 tablet (100 mg total) by mouth every 12 (twelve) hours.     loratadine 10 MG tablet  Commonly known as:  CLARITIN  Take 10 mg by mouth daily as needed for allergies.     ranitidine 150 MG tablet  Commonly known as:  ZANTAC  Take 150 mg by mouth 2 (two) times daily as needed for heartburn.           Follow-up Information   Follow up with PCP. Schedule an appointment as soon as possible for a visit in 1 week.      The results of significant diagnostics from this hospitalization (including imaging, microbiology, ancillary and laboratory) are listed below for reference.    Significant Diagnostic Studies: Dg Ankle Complete Left  Feb 25, 2014   CLINICAL DATA:  Fall, left ankle pain  EXAM: LEFT ANKLE COMPLETE - 3+ VIEW  COMPARISON:  None.  FINDINGS: No fracture or dislocation is seen.  The joint spaces are  preserved.  Moderate plantar and posterior calcaneal enthesophytes.  Visualized soft tissues are grossly unremarkable.  IMPRESSION: No fracture or dislocation is seen.   Electronically Signed   By: Charline Bills M.D.   On: 02/25/14 08:39   Ct Angio Chest Pe W/cm &/or Wo Cm  25-Feb-2014   CLINICAL DATA:  Syncope.  Elevated D-dimer.  EXAM: CT ANGIOGRAPHY CHEST WITH CONTRAST  TECHNIQUE: Multidetector CT imaging of the chest was performed using the standard protocol during bolus administration of intravenous contrast. Multiplanar CT image reconstructions and MIPs were obtained to evaluate the vascular anatomy.  CONTRAST:  OMNIPAQUE IOHEXOL 350 MG/ML SOLN  COMPARISON:  None.  FINDINGS: Examination is limited by patient body habitus. No definite filling defects are identified in the main, lobar, or proximal segmental pulmonary arteries. The heart is upper limits of normal in size. No enlarged axillary, mediastinal, or hilar lymph nodes are identified.  The lung bases more incompletely imaged. No pleural effusion is identified. No lung consolidation or nodules are identified. The visualized portion of the upper abdomen is unremarkable. Multilevel bridging osteophytosis is present in the thoracic spine.  Review of the MIP images confirms the above findings.  IMPRESSION: Suboptimal examination due to patient body habitus. No definite central pulmonary embolus identified.   Electronically Signed   By: Sebastian Ache   On: 02/25/14 22:56   Dg Foot Complete Left  02/22/2014   CLINICAL DATA:  Left foot pain after injury.  EXAM: LEFT FOOT - COMPLETE 3+ VIEW  COMPARISON:  None.  FINDINGS: There is no evidence of fracture or dislocation. Joint spaces are intact. Spurring of posterior calcaneus is noted. Soft tissues are unremarkable.  IMPRESSION: No acute abnormality seen in the left foot.   Electronically Signed   By: Roque Lias M.D.   On: 02/22/2014 16:07   Labs: Basic Metabolic Panel:  Recent Labs Lab  02/22/14 1533 02-25-14 0546 February 25, 2014 0847  NA 141 140  --   K 4.1 4.1  --   CL 103 103  --   CO2  --  24  --   GLUCOSE 102* 125*  --   BUN 11 10  --   CREATININE 0.80 0.77 0.79  CALCIUM  --  9.2  --    Liver Function Tests:  Recent Labs Lab Feb 25, 2014 0546  AST 16  ALT 13  ALKPHOS 133*  BILITOT 0.5  PROT 7.3  ALBUMIN 3.2*   CBC:  Recent Labs Lab 02/22/14 1533 02/23/14 0546 02/23/14 0847  WBC  --  6.4 6.2  HGB 15.6* 13.1 13.7  HCT 46.0 39.8 41.7  MCV  --  82.4 82.7  PLT  --  199 201   Cardiac Enzymes:  Recent Labs Lab 02/23/14 0546 02/23/14 1340  TROPONINI <0.30 <0.30   Signed:  Pamella PertGHERGHE, Bera Pinela  [facility] 02/24/2014, 3:21 PM

## 2014-02-24 NOTE — Progress Notes (Signed)
  Echocardiogram 2D Echocardiogram has been performed.  Molly Hoffman, Molly Hoffman 02/24/2014, 9:48 AM

## 2014-02-24 NOTE — Discharge Instructions (Signed)
Follow with Primary MD in 7 days   Get CBC, CMP, checked 7 days by Primary MD and again as instructed by your Primary MD. Get a 2 view Chest X ray done next visit if you had Pneumonia of Lung problems at the Hospital.   Activity: As tolerated with Full fall precautions use walker/cane & assistance as needed   Disposition Home     Diet: Heart Healthy    For Heart failure patients - Check your Weight same time everyday, if you gain over 2 pounds, or you develop in leg swelling, experience more shortness of breath or chest pain, call your Primary MD immediately. Follow Cardiac Low Salt Diet and 1.8 lit/day fluid restriction.   On your next visit with her primary care physician please Get Medicines reviewed and adjusted.  Please request your Prim.MD to go over all Hospital Tests and Procedure/Radiological results at the follow up, please get all Hospital records sent to your Prim MD by signing hospital release before you go home.   If you experience worsening of your admission symptoms, develop shortness of breath, life threatening emergency, suicidal or homicidal thoughts you must seek medical attention immediately by calling 911 or calling your MD immediately  if symptoms less severe.  You Must read complete instructions/literature along with all the possible adverse reactions/side effects for all the Medicines you take and that have been prescribed to you. Take any new Medicines after you have completely understood and accpet all the possible adverse reactions/side effects.   Do not drive and provide baby sitting services if your were admitted for syncope or siezures until you have seen by Primary MD or a Neurologist and advised to do so again.  Do not drive when taking Pain medications.    Do not take more than prescribed Pain, Sleep and Anxiety Medications  Special Instructions: If you have smoked or chewed Tobacco  in the last 2 yrs please stop smoking, stop any regular Alcohol  and  or any Recreational drug use.  Wear Seat belts while driving.   Please note  You were cared for by a hospitalist during your hospital stay. If you have any questions about your discharge medications or the care you received while you were in the hospital after you are discharged, you can call the unit and asked to speak with the hospitalist on call if the hospitalist that took care of you is not available. Once you are discharged, your primary care physician will handle any further medical issues. Please note that NO REFILLS for any discharge medications will be authorized once you are discharged, as it is imperative that you return to your primary care physician (or establish a relationship with a primary care physician if you do not have one) for your aftercare needs so that they can reassess your need for medications and monitor your lab values.  Chronic Ankle Instability with Rehab Chronic ankle instability is characterized by instability of the ankle for a prolonged period of time. There are two types of ankle instability.   A functionally unstable ankle is one that gives way; however, it may or may not be loose.  A mechanically unstable ankle is one that is loose due to a problem with the ligaments. However, not all loose ankles are unstable or give way. SYMPTOMS   Recurrent ankle pain and giving way of the ankle.  Difficulty running on uneven surfaces, jumping, or changing directions while running (cutting).  Pain, tenderness, swelling, and bruising at the site  of injury.  Weakness or looseness in the ankle joint.  Occasionally, impaired ability to walk soon after injury. CAUSES   Ankle instability is most commonly caused by a previous ankle injury that did not completely heal.  Ankle instability may also be caused by stress imposed from either side of the ankle joint that can temporarily force or pry the ankle bone (talus) out of its normal alignment. The ligaments that hold  the joint in place are stretched and torn. RISK INCREASES WITH:  Previous ankle injury.  You were born with (congenital) joint looseness.  Too-rapid return to activity after previous ankle sprain.  Activities in which the foot may land sideways while running, walking, and jumping (basketball, volleyball, or soccer) or walking or running on uneven or rough surfaces.  Inadequate ankle support during athletics.  Poor strength and flexibility.  Poor balance skills. PREVENTION  Warm up and stretch properly before activity.  Maintain physical fitness:  Ankle and leg flexibility, muscle strength, and endurance.  Balanced training activities.  Cardiovascular fitness.  Learn and use proper technique during sports and have a coach correct improper technique.  Taping, protective strapping, bracing, or high-top tennis shoes may be used. Initially, tape is best; however, it loses most of its support function within 10 to 15 minutes.  Wear proper protective shoes (high-top shoes with taping or bracing).  Provide the ankle with support during sports and practice activities for 12 months following injury.  Complete rehabilitation after initial injury. PROGNOSIS  If treated properly, ankle instability normally resolves with non-surgical treatment. However, for certain cases of mechanical instability surgery is necessary. RELATED COMPLICATIONS   Frequent recurrence of symptoms is possible. Following rehabilitation guidelines correctly decreases the frequency of recurrence and optimizes healing time.  Injury to other structures (bone, cartilage, or tendon).  Chronically unstable or arthritic ankle joint.  Complications of surgery including infection, bleeding, injury to nerves, continued giving way, ankle stiffness, and ankle weakness. TREATMENT Treatment initially involves ice, medication, and compression bandages are used to help reduce pain and inflammation, It may be necessary to  immobilize the joint for a period of time to allow for healing. Strengthening and stretching exercises are recommended after immobilization to help regain strength and flexibility. These exercises may be completed at home or with a therapist. Some individuals find placing a heel wedge in the shoe, taping or bracing, and wearing high-top shoes helpful. If symptoms last for longer than 3 months, despite treatment, then surgery may be recommended. HEAT AND COLD  Cold treatment (icing) relieves pain and reduces inflammation. Cold treatment should be applied for 10 to 15 minutes every 2 to 3 hours for inflammation and pain and immediately after any activity that aggravates your symptoms. Use ice packs or an ice massage.  Heat treatment may be used prior to performing the stretching and strengthening activities prescribed by your caregiver, physical therapist, or athletic trainer. Use a heat pack or a warm soak. MEDICATION   There are no specific medications to improve the stability of your ankle.  If pain medication is necessary, then nonsteroidal anti-inflammatory medications, such as aspirin and ibuprofen, or other minor pain relievers, such as acetaminophen, are often recommended.  Do not take pain medication within 7 days before surgery.  Prescription pain relievers may be prescribed if deemed necessary by your caregiver. Use only as directed and only as much as you need.  Ointments applied to the skin may be helpful. SEEK MEDICAL CARE IF:   Pain, swelling, or bruising  worsens despite treatment.  You develop locking or catching in the ankle.  You have pain, numbness, or coldness in the foot.  You develop giving way of the ankle which persists after 3 to 6 months of rehabilitation. EXERCISES  RANGE OF MOTION AND STRETCHING EXERCISES - Ankle Instability, Chronic, Non-Surgical Intervention Since ankles demonstrate instability when they have too much motion throughout the joints, range of  motion and stretching exercises are not helpful and can even be harmful. Only complete range of motion and stretching exercises for your ankle if instructed by your physician, physical therapist or athletic trainer. An effective rehabilitation program for unstable ankles will include mostly strengthening and balance exercises. STRENGTHENING EXERCISES - Ankle Instability, Chronic, Non-Surgical Intervention  These exercises may help you when beginning to rehabilitate your injury. They may resolve your symptoms with or without further involvement from your physician, physical therapist or athletic trainer. While completing these exercises, remember:  Muscles can gain both the endurance and the strength needed for everyday activities through controlled exercises.  Complete these exercises as instructed by your physician, physical therapist or athletic trainer. Progress the resistance and repetitions only as guided.  You may experience muscle soreness or fatigue, but the pain or discomfort you are trying to eliminate should never worsen during these exercises. If this pain does worsen, stop and make certain you are following the directions exactly. If the pain is still present after adjustments, discontinue the exercise until you can discuss the trouble with your clinician. STRENGTH - Dorsiflexors  Secure a rubber exercise band/tubing to a fixed object (table, pole) and loop the other end around your right / left foot.  Sit on the floor facing the fixed object. The band/tubing should be slightly tense when your foot is relaxed.  Slowly draw your foot back toward you using your ankle and toes.  Hold this position for __________ seconds. Slowly release the tension in the band and return your foot to the starting position. Repeat __________ times. Complete this exercise __________ times per day.  STRENGTH - Plantar-flexors  Sit with your right / left leg extended. Holding onto both ends of a rubber  exercise band/tubing, loop it around the ball of your foot. Keep a slight tension in the band.  Slowly push your toes away from you, pointing them downward.  Hold this position for __________ seconds. Return slowly, controlling the tension in the band/tubing. Repeat __________ times. Complete this exercise __________ times per day.  STRENGTH - Plantar-flexors, Standing   Stand with your feet shoulder width apart. Steady yourself with a wall or table using as little support as needed.  Keeping your weight evenly spread over the width of your feet, rise up on your toes.*  Hold this position for __________ seconds. Repeat __________ times. Complete this exercise __________ times per day.  *If this is too easy, shift your weight toward your right / left leg until you feel challenged. Ultimately, you may be asked to do this exercise with your right / left foot only. STRENGTH - Ankle Eversion  Secure one end of a rubber exercise band/tubing to a fixed object (table, pole). Loop the other end around your foot just before your toes.  Place your fists between your knees. This will focus your strengthening at your ankle.  Drawing the band/tubing across your opposite foot, slowly, pull your little toe out and up. Make sure the band/tubing is positioned to resist the entire motion.  Hold this position for __________ seconds.  Have your  muscles resist the band/tubing as it slowly pulls your foot back to the starting position. Repeat __________ times. Complete this exercise __________ times per day.  STRENGTH - Ankle Inversion  Secure one end of a rubber exercise band/tubing to a fixed object (table, pole). Loop the other end around your foot just before your toes.  Place your fists between your knees. This will focus your strengthening at your ankle.  Slowly, pull your big toe up and in, making sure the band/tubing is positioned to resist the entire motion.  Hold this position for __________  seconds.  Have your muscles resist the band/tubing as it slowly pulls your foot back to the starting position. Repeat __________ times. Complete this exercises __________ times per day.  STRENGTH - Towel Curls  Sit in a chair positioned on a non-carpeted surface.  Place your foot on a towel, keeping your heel on the floor.  Pull the towel toward your heel by only curling your toes. Keep your heel on the floor.  If instructed by your physician, physical therapist or athletic trainer, add weight to the end of the towel. Repeat __________ times. Complete this exercise __________ times per day. STRENGTH  Dorsiflexors and Plantar-flexors, Heel/toe Walking  Dorsiflexion: Walk on your heels only. Keep your toes as high as possible.  Repeat __________ times. Complete __________ times per day.  Plantar flexion: Walk on your toes only. Keep your heels as high as possible.  Walk for ____________________ seconds/feet. Repeat __________ times. Complete __________ times per day.  BALANCE  Tandem Walking  Place your uninjured foot on a line 2-4 inches wide and at least 10 feet long.  Keeping your balance without using anything for extra support, place your right / left heel directly in front of your other foot.  Slowly raise your back foot up, lifting from the heel to the toes, and place it directly in front of the right / left foot.  Continue to walk along the line slowly. Walk for ____________________ feet. Repeat ____________________ times. Complete ____________________ times per day. BALANCE - Inversion/Eversion Use caution, these are advanced level exercises. Do not begin them until you are advised to do so.   Create a balance board using a sturdy board about 1  feet long and at 1-1  feet wide and a 1  inch diameter rod or pipe that is as long as the board's width. A copper pipe or a solid broomstick work well.  Stand on a non-carpeted surface near a countertop or wall. Step onto the  board so that your feet are hip-width apart and equally straddle the rod/pipe.  Keeping your feet in place, complete these two exercises without shifting your upper body or hips:  Tip the board from side-to-side. Control the movement so the board does not forcefully strike the ground. The board should silently tap the ground.  Tip the board side-to-side without striking the ground. Occasionally pause and maintain a steady position at various points.  Repeat the first two exercises, but use only your right / left foot. Place your right / left foot directly over the rod/pipe. Repeat __________ times. Complete this exercise __________ times a day. BALANCE - Plantar/Dorsi Flexion Use caution, these are advanced level exercises. Do not begin them until you are advised to do so.  Create a balance board using a sturdy board about 1  feet long and at 1-1  feet wide and a 1  inch diameter rod or pipe that is as long as the board's  width. A copper pipe or a solid broomstick work well.  Stand on a non-carpeted surface near a countertop or wall. Stand on the board so that the rod/pipe runs under the arches in your feet.  Keeping your feet in place, complete these two exercises without shifting your upper body or hips:  Tip the board from side-to-side. Control the movement so the board does not forcefully strike the ground. The board should silently tap the ground.  Tip the board side-to-side without striking the ground. Occasionally pause and maintain a steady position at various points.  Repeat the first two exercises, but use only your right / left foot. Stand in the center of the board. Repeat __________ times. Complete this exercise __________ times a day. STRENGTH  Plantar-flexors, Eccentric Note: This exercise can place a lot of stress on your foot and ankle. Please complete this exercise only if specifically instructed by your caregiver.   Place the balls of your feet on a step. With your  hands, use only enough support from a wall or rail to keep your balance.  Keep your knees straight and rise up on your toes.  Slowly shift your weight entirely to your toes and pick up your opposite foot. Gently and with controlled movement, lower your weight through your right / left foot so that your heel drops below the level of the step. You will feel a slight stretch in the back of your calf at the ending position.  Use the healthy leg to help rise up onto the balls of both feet, then lower weight only on the right / left leg again. Build up to 15 repetitions. Then progress to 3 consecutive sets of 15 repetitions.*  After completing the above exercise, complete the same exercise with a slight knee bend (about 30 degrees). Again, build up to 15 repetitions. Then progress to 3 consecutive sets of 15 repetitions.* Perform this exercise __________ times per day.  *When you easily complete 3 sets of 15, your physician, physical therapist or athletic trainer may advise you to add resistance by wearing a backpack filled with additional weight. Document Released: 06/22/2005 Document Revised: 02/13/2012 Document Reviewed: 03/05/2009 [facility] Patient Information 2014 [place], [place].
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
## 2015-04-08 IMAGING — CT CT ANGIO CHEST
2 of 8 series · 19 of 46 positions shown · IV contrast (APPLIED)
Comparison: None.

CLINICAL DATA: Syncope.  Elevated D-dimer.

EXAM:
CT ANGIOGRAPHY CHEST WITH CONTRAST
TECHNIQUE: Multidetector CT imaging of the chest was performed using the
standard protocol during bolus administration of intravenous
contrast. Multiplanar CT image reconstructions and MIPs were
obtained to evaluate the vascular anatomy.
CONTRAST:  100mL OMNIPAQUE IOHEXOL 350 MG/ML SOLN

[Series 5: thins · axial · 0.68mm/px · z∈[+1354,+1511]mm · 16 of 173 slices shown]
[im 8/173  lung]
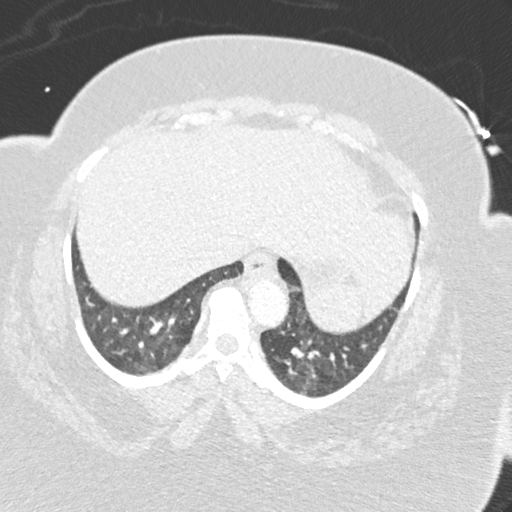
[im 16/173  soft-tissue]
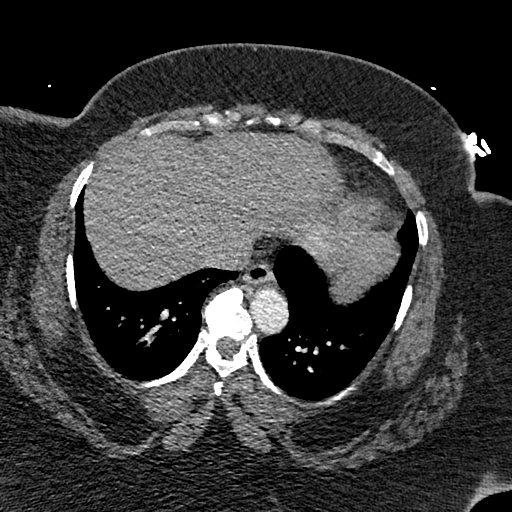
[im 32/173  lung]
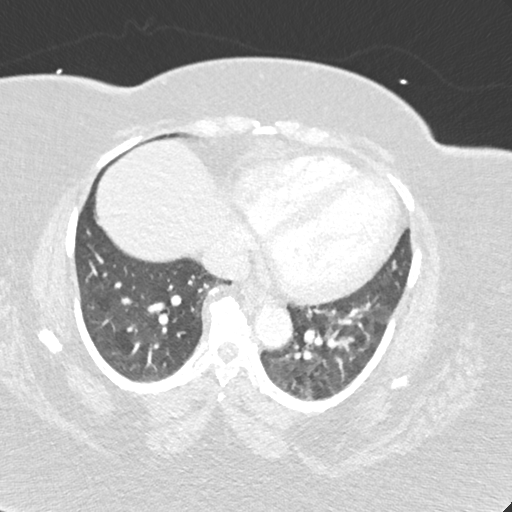
[im 40/173  soft-tissue]
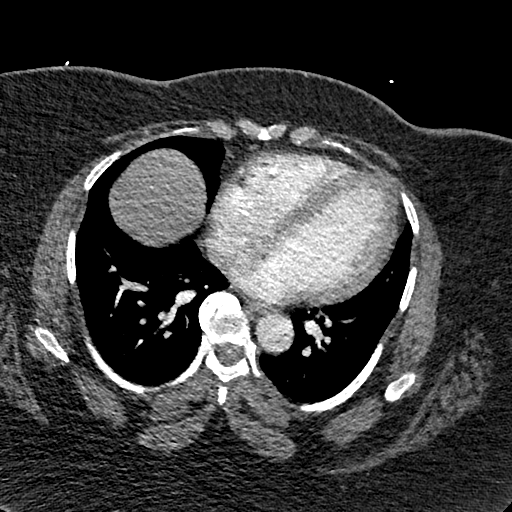
[im 47/173  lung]
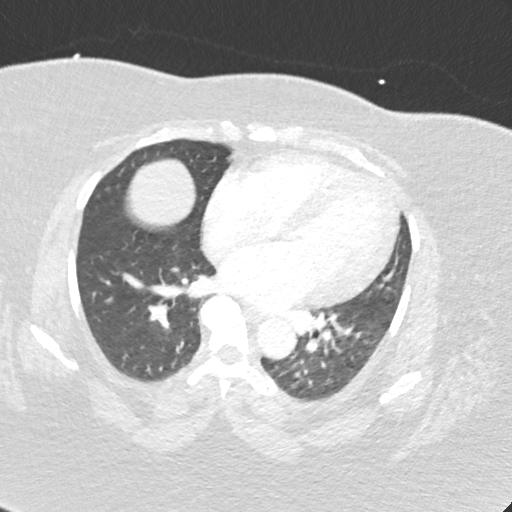
[im 63/173  soft-tissue]
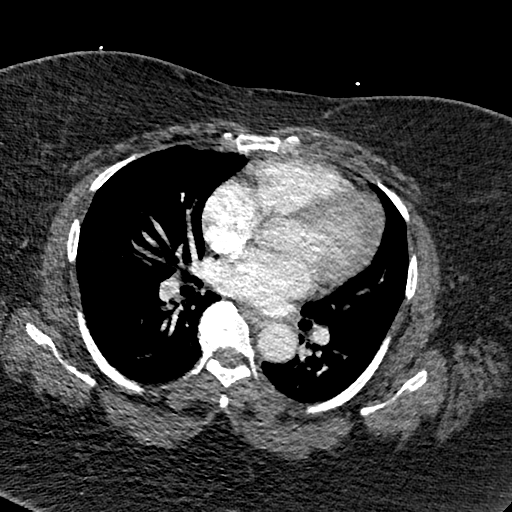
[im 71/173  lung]
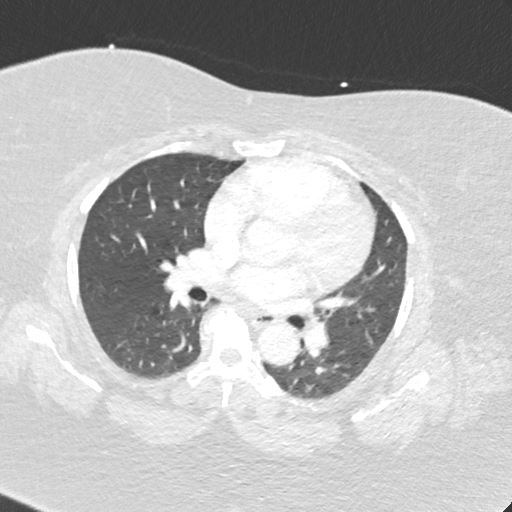
[im 79/173  soft-tissue]
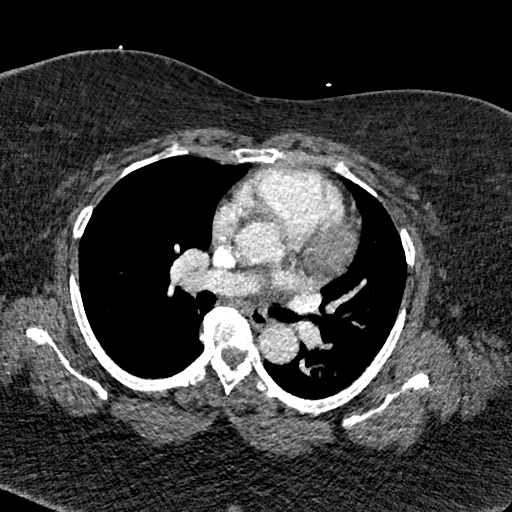
[im 94/173  lung]
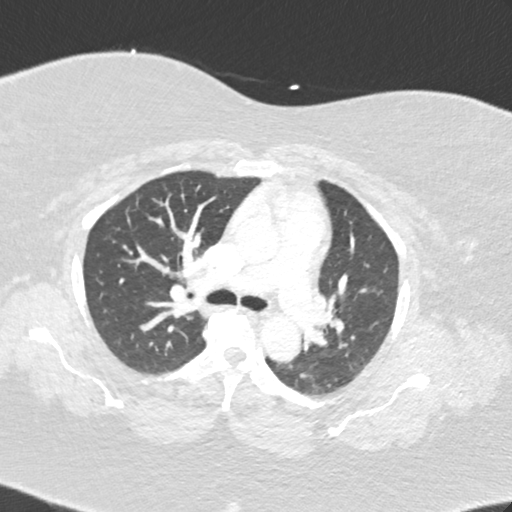
[im 102/173  soft-tissue]
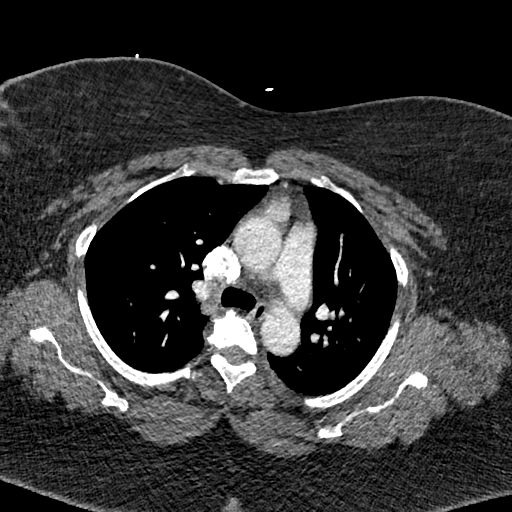
[im 110/173  lung]
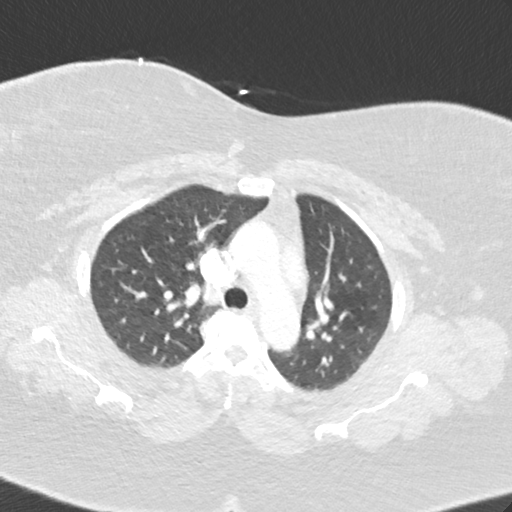
[im 126/173  soft-tissue]
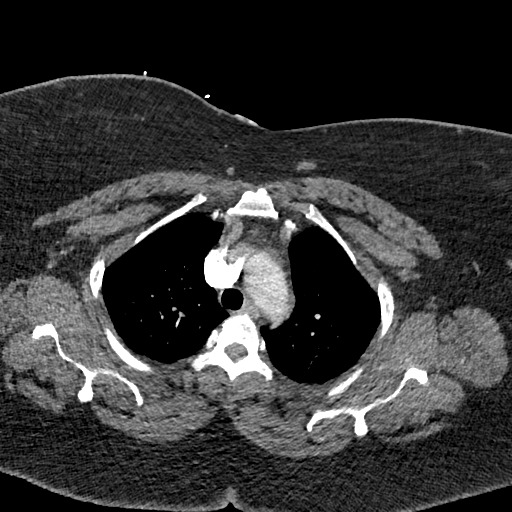
[im 133/173  lung]
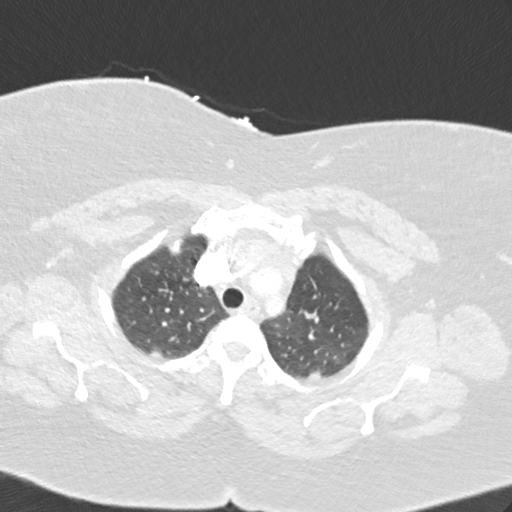
[im 141/173  soft-tissue]
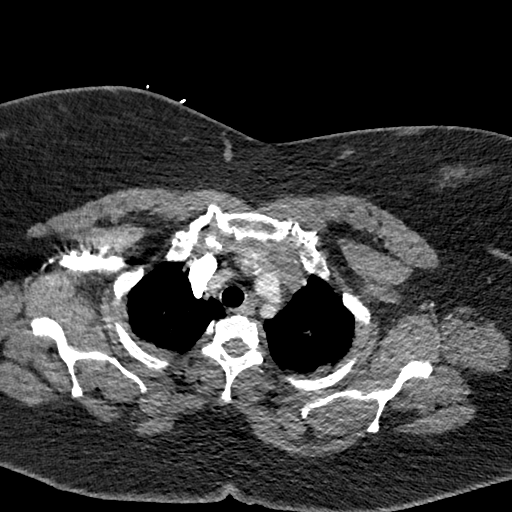
[im 157/173  lung]
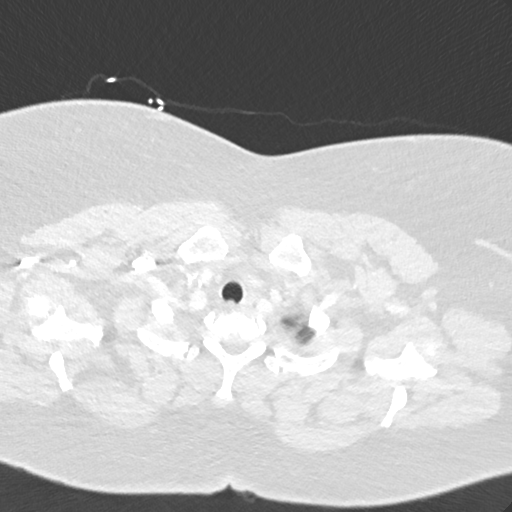
[im 165/173  soft-tissue]
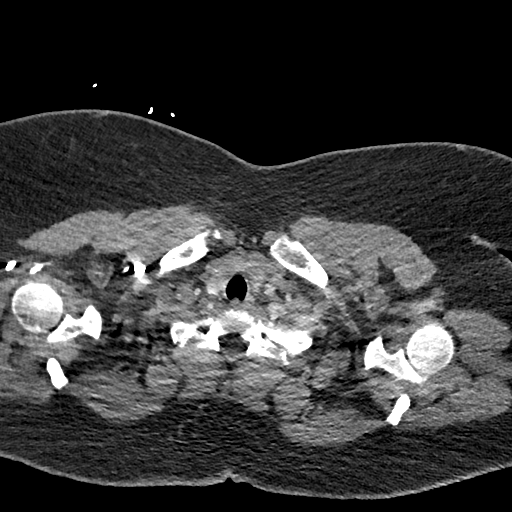

[Series 7: coronal mpr · coronal · 0.59mm/px · 3 of 106 slices shown]
[im 27/106  soft-tissue]
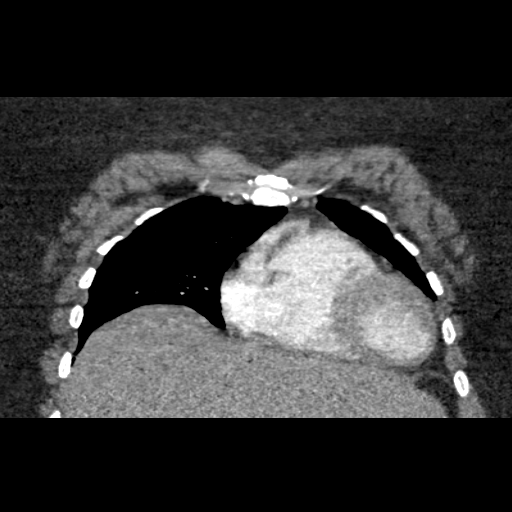
[im 53/106  soft-tissue]
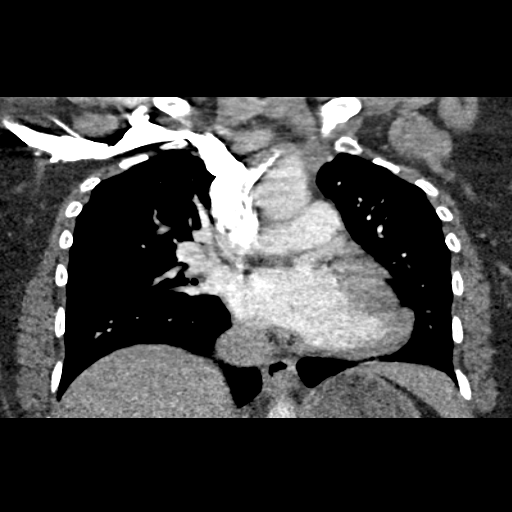
[im 79/106  soft-tissue]
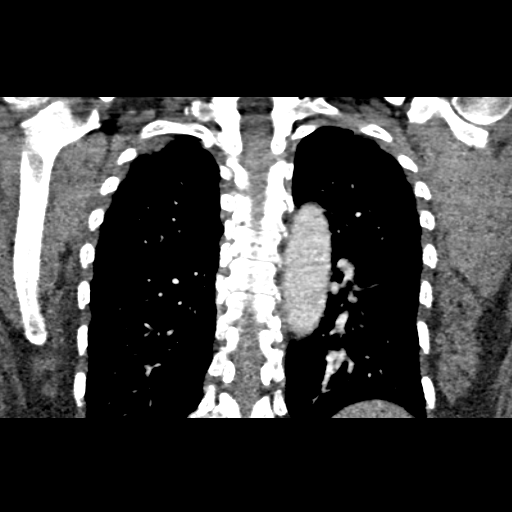

[19 of 46 positions shown; findings below may reference images not displayed]

FINDINGS: Examination is limited by patient body habitus. No definite filling
defects are identified in the main, lobar, or proximal segmental
pulmonary arteries. The heart is upper limits of normal in size. No
enlarged axillary, mediastinal, or hilar lymph nodes are identified.

The lung bases more incompletely imaged. No pleural effusion is
identified. No lung consolidation or nodules are identified. The
visualized portion of the upper abdomen is unremarkable. Multilevel
bridging osteophytosis is present in the thoracic spine.

Review of the MIP images confirms the above findings.
IMPRESSION: Suboptimal examination due to patient body habitus. No definite
central pulmonary embolus identified.
